# Patient Record
Sex: Female | Born: 1990
Health system: Midwestern US, Community
[De-identification: ages and names within clinical notes are randomized; demographics above are authoritative.]

## PROBLEM LIST (undated history)

## (undated) DIAGNOSIS — B379 Candidiasis, unspecified: Secondary | ICD-10-CM

---

## 2012-10-28 NOTE — Telephone Encounter (Signed)
Spoke with member states he had physical earlier in the year with Dr Hilda Blades.

## 2013-10-19 NOTE — Telephone Encounter (Signed)
Spoke to member who is away at college. She states she will call to schedule a Well Woman Exam and PAP test when she is home on break.

## 2015-09-21 ENCOUNTER — Emergency Department (HOSPITAL_COMMUNITY): Payer: Self-pay

## 2015-09-21 ENCOUNTER — Emergency Department (HOSPITAL_COMMUNITY)
Admission: EM | Admit: 2015-09-21 | Discharge: 2015-09-22 | Disposition: A | Discharge status: Home / Self Care | Payer: Self-pay | Attending: Emergency Medicine | Admitting: Emergency Medicine

## 2015-09-21 ENCOUNTER — Encounter (HOSPITAL_COMMUNITY): Payer: Self-pay | Admitting: Emergency Medicine

## 2015-09-21 DIAGNOSIS — F17200 Nicotine dependence, unspecified, uncomplicated: Secondary | ICD-10-CM | POA: Insufficient documentation

## 2015-09-21 DIAGNOSIS — Y999 Unspecified external cause status: Secondary | ICD-10-CM | POA: Insufficient documentation

## 2015-09-21 DIAGNOSIS — Y92410 Unspecified street and highway as the place of occurrence of the external cause: Secondary | ICD-10-CM | POA: Insufficient documentation

## 2015-09-21 DIAGNOSIS — F1290 Cannabis use, unspecified, uncomplicated: Secondary | ICD-10-CM | POA: Insufficient documentation

## 2015-09-21 DIAGNOSIS — M542 Cervicalgia: Secondary | ICD-10-CM | POA: Insufficient documentation

## 2015-09-21 DIAGNOSIS — R51 Headache: Secondary | ICD-10-CM | POA: Insufficient documentation

## 2015-09-21 DIAGNOSIS — Y9389 Activity, other specified: Secondary | ICD-10-CM | POA: Insufficient documentation

## 2015-09-21 DIAGNOSIS — R519 Headache, unspecified: Secondary | ICD-10-CM

## 2015-09-21 HISTORY — DX: Candidiasis, unspecified: B37.9

## 2015-09-21 MED ORDER — IBUPROFEN 200 MG PO TABS
400.0000 mg | ORAL_TABLET | Freq: Once | ORAL | Status: AC
  Administered 2015-09-21: 400 mg via ORAL
  Filled 2015-09-21: qty 2

## 2015-09-21 MED ORDER — ACETAMINOPHEN 500 MG PO TABS
1000.0000 mg | ORAL_TABLET | Freq: Once | ORAL | Status: AC
  Administered 2015-09-21: 1000 mg via ORAL
  Filled 2015-09-21: qty 2

## 2015-09-21 MED ORDER — METOCLOPRAMIDE HCL 10 MG PO TABS: 10.0000 mg | ORAL_TABLET | Freq: Four times a day (QID) | ORAL | 0 refills | Status: AC | PRN

## 2015-09-21 MED ORDER — METHOCARBAMOL 500 MG PO TABS: 1000.0000 mg | ORAL_TABLET | Freq: Four times a day (QID) | ORAL | 0 refills | Status: AC | PRN

## 2015-09-21 NOTE — Discharge Instructions (Signed)
Take the prescriptions as directed.  Take over the counter tylenol, ibuprofen and benadryl, as directed on packaging, with the prescription given to you today, as needed for headache. Apply moist heat or ice to the area(s) of discomfort, for 15 minutes at a time, several times per day for the next few days.  Do not fall asleep on a heating or ice pack.  Call your regular medical doctor tomorrow to schedule a follow up appointment within the next 3 days.  Return to the Emergency Department immediately if worsening.
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
## 2017-02-03 ENCOUNTER — Emergency Department: Payer: Self-pay

## 2017-02-03 ENCOUNTER — Emergency Department
Admission: EM | Admit: 2017-02-03 | Discharge: 2017-02-03 | Disposition: A | Discharge status: Home / Self Care | Payer: Self-pay | Attending: Emergency Medicine | Admitting: Emergency Medicine

## 2017-02-03 ENCOUNTER — Encounter: Payer: Self-pay | Admitting: Emergency Medicine

## 2017-02-03 DIAGNOSIS — Z91040 Latex allergy status: Secondary | ICD-10-CM | POA: Insufficient documentation

## 2017-02-03 DIAGNOSIS — Y998 Other external cause status: Secondary | ICD-10-CM | POA: Insufficient documentation

## 2017-02-03 DIAGNOSIS — Z87891 Personal history of nicotine dependence: Secondary | ICD-10-CM | POA: Insufficient documentation

## 2017-02-03 DIAGNOSIS — Y9389 Activity, other specified: Secondary | ICD-10-CM | POA: Insufficient documentation

## 2017-02-03 DIAGNOSIS — X58XXXA Exposure to other specified factors, initial encounter: Secondary | ICD-10-CM | POA: Insufficient documentation

## 2017-02-03 DIAGNOSIS — Y929 Unspecified place or not applicable: Secondary | ICD-10-CM | POA: Insufficient documentation

## 2017-02-03 DIAGNOSIS — Z79899 Other long term (current) drug therapy: Secondary | ICD-10-CM | POA: Insufficient documentation

## 2017-02-03 DIAGNOSIS — S93491A Sprain of other ligament of right ankle, initial encounter: Secondary | ICD-10-CM | POA: Insufficient documentation

## 2017-02-03 LAB — POCT PREGNANCY, URINE: PREG TEST UR: NEGATIVE

## 2017-02-03 MED ORDER — MELOXICAM 15 MG PO TABS: 15.0000 mg | ORAL_TABLET | Freq: Every day | ORAL | 0 refills | Status: AC

## 2017-02-03 NOTE — ED Triage Notes (Signed)
Pt stating that she took 5 650mg  Tylenols around 1530
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
## 2017-02-03 NOTE — ED Triage Notes (Signed)
Pt stating that she originally had her fall on Monday 01/26/2017
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
## 2018-08-24 IMAGING — DX DG ANKLE COMPLETE 3+V*R*
3 series · 3 of 3 positions shown · non-contrast
Comparison: None.

CLINICAL DATA: Injury.  Fall.

EXAM:
RIGHT ANKLE - COMPLETE 3+ VIEW

[ankle ap]
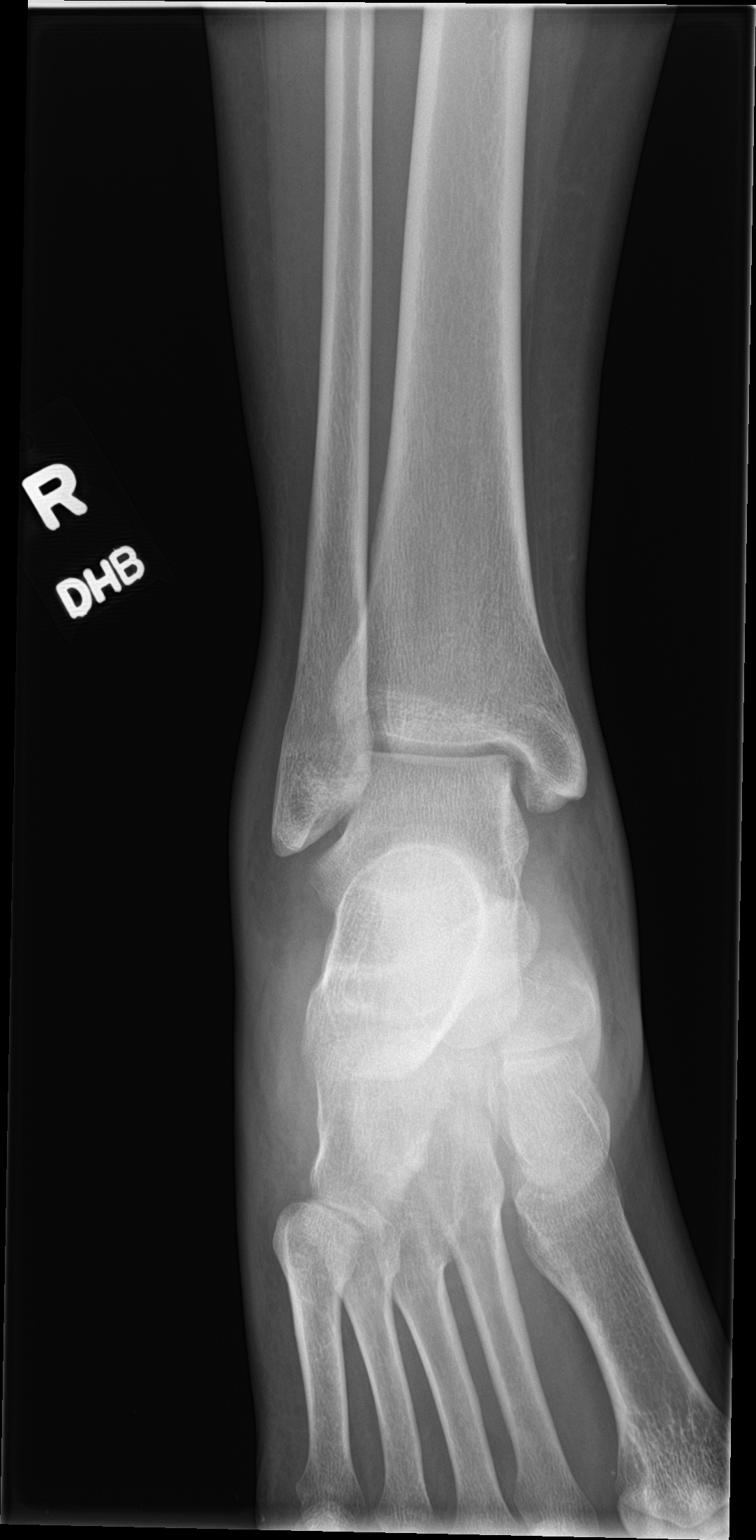

[ankle obl]
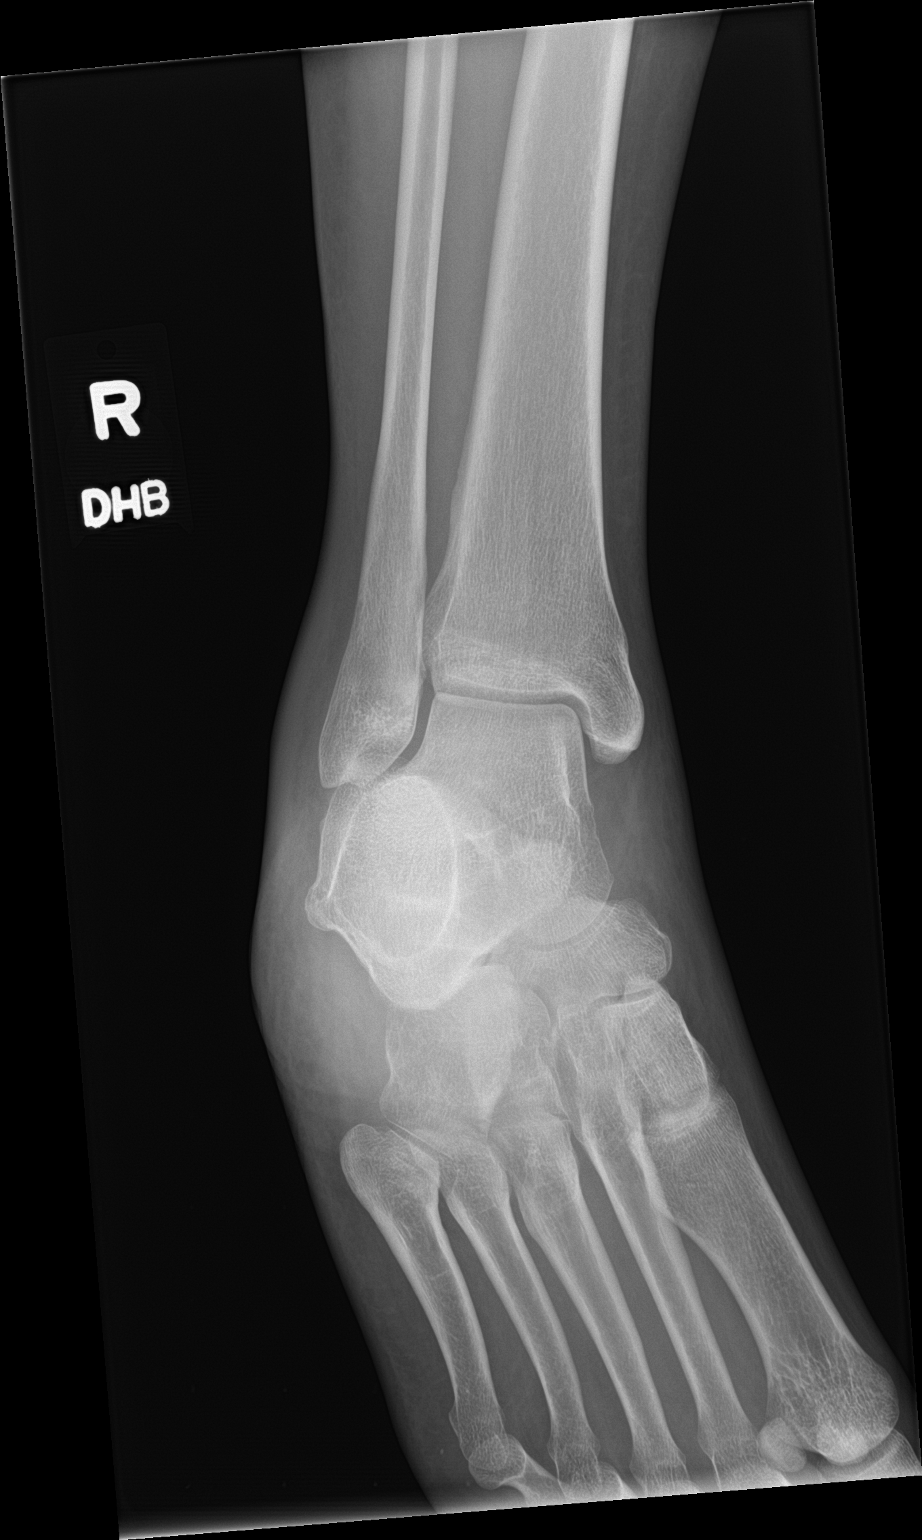

[ankle lat]
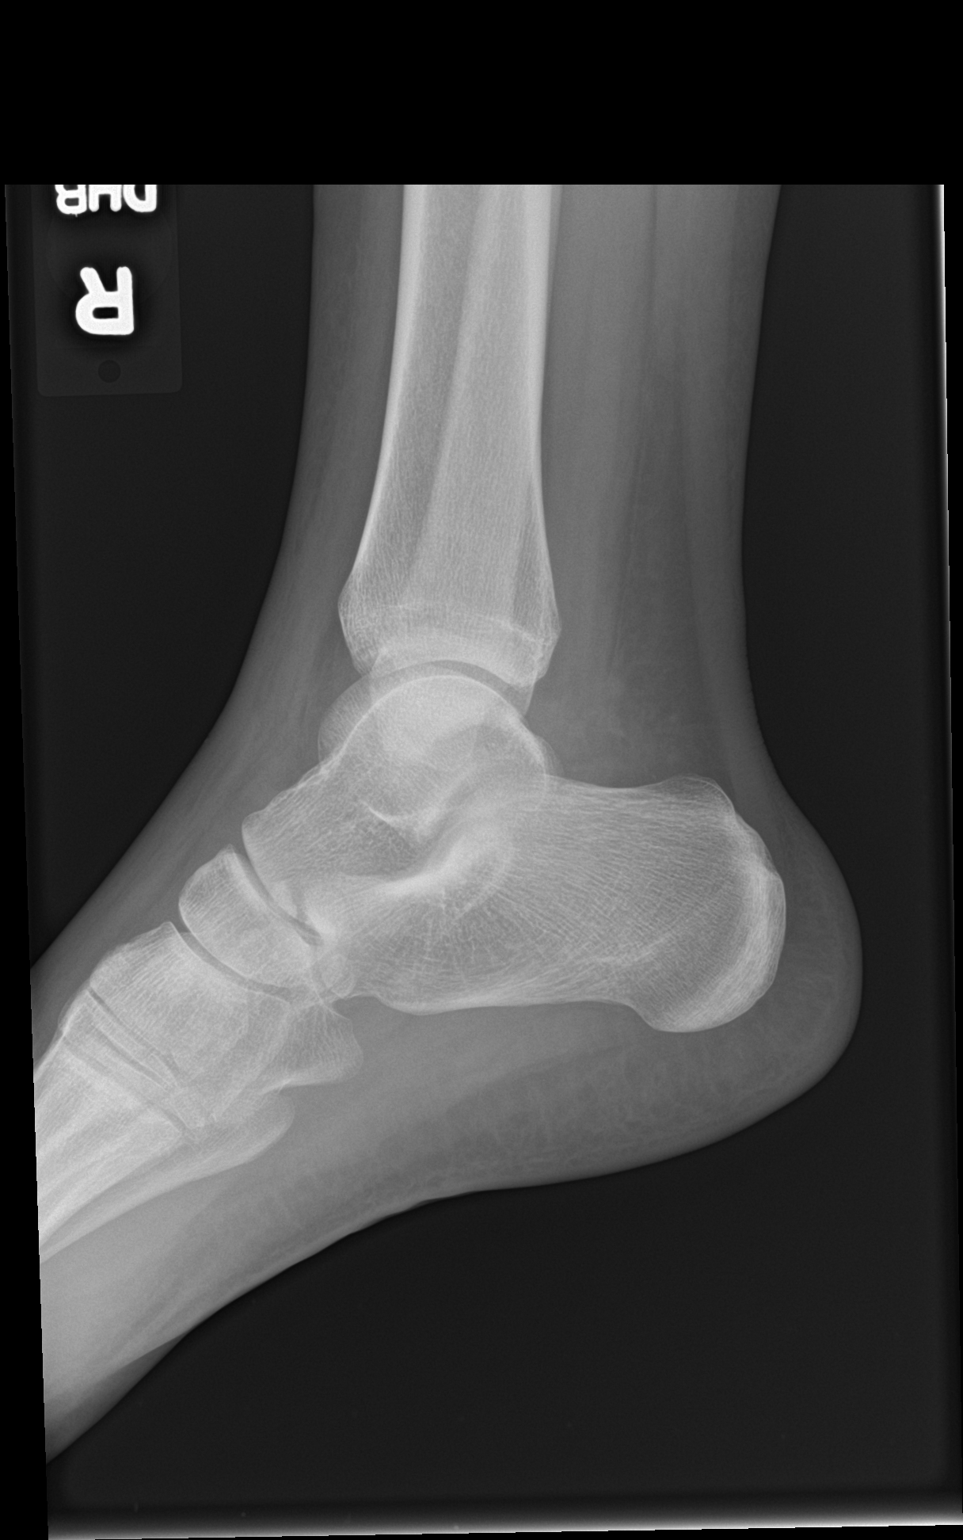

[3 of 3 positions shown; findings below may reference images not displayed]

FINDINGS: There is mild diffuse soft tissue swelling. No underlying fracture
or dislocation identified. No radiopaque foreign bodies are soft
tissue calcifications.
IMPRESSION: 1. Soft tissue swelling.
2. No ankle fracture identified .
